# Patient Record
Sex: Female | Born: 1960 | Race: Black or African American | Hispanic: No | Smoking: Current every day smoker
Health system: Southern US, Community
[De-identification: ages and names within clinical notes are randomized; demographics above are authoritative.]

---

## 2014-11-17 ENCOUNTER — Encounter (HOSPITAL_COMMUNITY): Payer: Self-pay | Admitting: Physical Medicine and Rehabilitation

## 2014-11-17 ENCOUNTER — Emergency Department (HOSPITAL_COMMUNITY)
Admission: EM | Admit: 2014-11-17 | Discharge: 2014-11-17 | Disposition: A | Discharge status: Home / Self Care | Payer: Self-pay | Attending: Emergency Medicine | Admitting: Emergency Medicine

## 2014-11-17 DIAGNOSIS — N390 Urinary tract infection, site not specified: Secondary | ICD-10-CM

## 2014-11-17 DIAGNOSIS — Z720 Tobacco use: Secondary | ICD-10-CM | POA: Insufficient documentation

## 2014-11-17 DIAGNOSIS — R112 Nausea with vomiting, unspecified: Secondary | ICD-10-CM

## 2014-11-17 LAB — URINE MICROSCOPIC-ADD ON

## 2014-11-17 LAB — CBC WITH DIFFERENTIAL/PLATELET
BASOS ABS: 0 10*3/uL (ref 0.0–0.1)
Basophils Relative: 0 %
Eosinophils Absolute: 0.1 10*3/uL (ref 0.0–0.7)
Eosinophils Relative: 1 %
HEMATOCRIT: 42.4 % (ref 36.0–46.0)
Hemoglobin: 14.3 g/dL (ref 12.0–15.0)
LYMPHS ABS: 2.7 10*3/uL (ref 0.7–4.0)
LYMPHS PCT: 38 %
MCH: 27.3 pg (ref 26.0–34.0)
MCHC: 33.7 g/dL (ref 30.0–36.0)
MCV: 80.9 fL (ref 78.0–100.0)
MONO ABS: 0.5 10*3/uL (ref 0.1–1.0)
Monocytes Relative: 7 %
NEUTROS ABS: 3.8 10*3/uL (ref 1.7–7.7)
Neutrophils Relative %: 54 %
Platelets: 282 10*3/uL (ref 150–400)
RBC: 5.24 MIL/uL — ABNORMAL HIGH (ref 3.87–5.11)
RDW: 13.3 % (ref 11.5–15.5)
WBC: 7.2 10*3/uL (ref 4.0–10.5)

## 2014-11-17 LAB — URINALYSIS, ROUTINE W REFLEX MICROSCOPIC
BILIRUBIN URINE: NEGATIVE
GLUCOSE, UA: NEGATIVE mg/dL
Ketones, ur: 15 mg/dL — AB
NITRITE: NEGATIVE
PH: 5 (ref 5.0–8.0)
Protein, ur: NEGATIVE mg/dL
SPECIFIC GRAVITY, URINE: 1.016 (ref 1.005–1.030)
Urobilinogen, UA: 1 mg/dL (ref 0.0–1.0)

## 2014-11-17 LAB — BASIC METABOLIC PANEL
ANION GAP: 11 (ref 5–15)
BUN: 13 mg/dL (ref 6–20)
CHLORIDE: 103 mmol/L (ref 101–111)
CO2: 22 mmol/L (ref 22–32)
Calcium: 9.6 mg/dL (ref 8.9–10.3)
Creatinine, Ser: 1.04 mg/dL — ABNORMAL HIGH (ref 0.44–1.00)
GFR calc Af Amer: >60 mL/min (ref >60)
GFR, EST NON AFRICAN AMERICAN: 59 mL/min — AB (ref >60)
GLUCOSE: 99 mg/dL (ref 65–99)
POTASSIUM: 3.9 mmol/L (ref 3.5–5.1)
Sodium: 136 mmol/L (ref 135–145)

## 2014-11-17 MED ORDER — CEPHALEXIN 500 MG PO CAPS: 500.0000 mg | ORAL_CAPSULE | Freq: Two times a day (BID) | ORAL | Status: AC

## 2014-11-17 MED ORDER — METOCLOPRAMIDE HCL 5 MG/ML IJ SOLN
10.0000 mg | Freq: Once | INTRAMUSCULAR | Status: AC
  Administered 2014-11-17: 10 mg via INTRAVENOUS
  Filled 2014-11-17: qty 2

## 2014-11-17 MED ORDER — SODIUM CHLORIDE 0.9 % IV BOLUS (SEPSIS)
1000.0000 mL | Freq: Once | INTRAVENOUS | Status: AC
  Administered 2014-11-17: 1000 mL via INTRAVENOUS

## 2014-11-17 MED ORDER — ONDANSETRON 4 MG PO TBDP: 4.0000 mg | ORAL_TABLET | Freq: Three times a day (TID) | ORAL | Status: AC | PRN

## 2014-11-17 MED ORDER — ONDANSETRON HCL 4 MG/2ML IJ SOLN
4.0000 mg | Freq: Once | INTRAMUSCULAR | Status: AC
  Administered 2014-11-17: 4 mg via INTRAVENOUS
  Filled 2014-11-17: qty 2

## 2014-11-17 NOTE — Progress Notes (Signed)
Felicia Evans °Community Health & Eligibility Specialist °[facility] °[phone] ° °Spoke to patient regarding primary care resources and the [facility] orange card. [facility] orange card application provided and explained, pt instructed to contact me once application was complete for an appointment. Resource guide and my contact information also provided for any future questions or concerns. No other Community Health & Eligibility Specialist needs identified at this time. ° °

## 2014-11-17 NOTE — ED Notes (Signed)
Pt ambulated to the bathroom with ease 

## 2014-11-17 NOTE — ED Notes (Signed)
Pt states nausea/vomiting and diffuse abdominal pain, onset Tuesday evening after eating can of sardines. Actively vomiting upon arrival to exam room.

## 2014-11-17 NOTE — ED Provider Notes (Signed)
CSN: [id number]     Arrival date & time 11/17/14  0850 History   First MD Initiated Contact with Patient 11/17/14 0900     Chief Complaint  Patient presents with  . Emesis  . Abdominal Pain     (Consider location/radiation/quality/duration/timing/severity/associated sxs/prior Treatment) HPI Comments: Pt comes in with c/o nausea and vomiting and diffuse abdominal pain that started yesterday. Denies fever or diarrhea. She state that she ate sardines and the symptoms started after that. She states that is because there were two many scales. No cp or sob. Hasn't tried anything for the symptoms.  The history is provided by the patient. No language interpreter was used.    History reviewed. No pertinent past medical history. History reviewed. No pertinent past surgical history. No family history on file. Social History  Substance Use Topics  . Smoking status: Current Every Day Smoker  . Smokeless tobacco: None  . Alcohol Use: Yes   OB History    No data available     Review of Systems  All other systems reviewed and are negative.     Allergies  Review of patient's allergies indicates no known allergies.  Home Medications   Prior to Admission medications   Not on File   BP 157/102 mmHg  Pulse 110  Temp(Src) 98.6 F (37 C) (Oral)  Resp 18  SpO2 99% Physical Exam  Constitutional: She is oriented to person, place, and time. She appears well-developed and well-nourished.  HENT:  Head: Normocephalic and atraumatic.  Cardiovascular: Normal rate and regular rhythm.   Pulmonary/Chest: Effort normal and breath sounds normal.  Abdominal: Soft. Bowel sounds are normal. There is no tenderness.  Musculoskeletal: Normal range of motion.  Neurological: She is alert and oriented to person, place, and time. She exhibits normal muscle tone. Coordination normal.  Skin: Skin is warm and dry.  Psychiatric: She has a normal mood and affect.  Nursing note and vitals reviewed.   ED  Course  Procedures (including critical care time) Labs Review Labs Reviewed  BASIC METABOLIC PANEL - Abnormal; Notable for the following:    Creatinine, Ser 1.04 (*)    GFR calc non Af Amer 59 (*)    All other components within normal limits  CBC WITH DIFFERENTIAL/PLATELET - Abnormal; Notable for the following:    RBC 5.24 (*)    All other components within normal limits  URINALYSIS, ROUTINE W REFLEX MICROSCOPIC (NOT AT [facility]) - Abnormal; Notable for the following:    APPearance CLOUDY (*)    Hgb urine dipstick SMALL (*)    Ketones, ur 15 (*)    Leukocytes, UA MODERATE (*)    All other components within normal limits  URINE MICROSCOPIC-ADD ON - Abnormal; Notable for the following:    Squamous Epithelial / LPF MANY (*)    Bacteria, UA MANY (*)    All other components within normal limits  URINE CULTURE    Imaging Review No results found. I have personally reviewed and evaluated these images and lab results as part of my medical decision-making.   EKG Interpretation None      MDM   Final diagnoses:  UTI (lower urinary tract infection)  Non-intractable vomiting with nausea, vomiting of unspecified type    Abdomen continues to be benign. Pt tolerating po here without any problem. Will send home with keflex and zofran. Discussed return precautions with pt    Teressa Lower, NP 11/17/14 1158  Laurence Spates, MD 11/17/14 1233

## 2014-11-17 NOTE — ED Notes (Signed)
Pt attempting to give urine sample

## 2014-11-17 NOTE — Discharge Instructions (Signed)
Nausea and Vomiting °Nausea is a sick feeling that often comes before throwing up (vomiting). Vomiting is a reflex where stomach contents come out of your mouth. Vomiting can cause severe loss of body fluids (dehydration). Children and elderly adults can become dehydrated quickly, especially if they also have diarrhea. Nausea and vomiting are symptoms of a condition or disease. It is important to find the cause of your symptoms. °CAUSES  °· Direct irritation of the stomach lining. This irritation can result from increased acid production (gastroesophageal reflux disease), infection, food poisoning, taking certain medicines (such as nonsteroidal anti-inflammatory drugs), alcohol use, or tobacco use. °· Signals from the brain. These signals could be caused by a headache, heat exposure, an inner ear disturbance, increased pressure in the brain from injury, infection, a tumor, or a concussion, pain, emotional stimulus, or metabolic problems. °· An obstruction in the gastrointestinal tract (bowel obstruction). °· Illnesses such as diabetes, hepatitis, gallbladder problems, appendicitis, kidney problems, cancer, sepsis, atypical symptoms of a heart attack, or eating disorders. °· Medical treatments such as chemotherapy and radiation. °· Receiving medicine that makes you sleep (general anesthetic) during surgery. °DIAGNOSIS °Your caregiver may ask for tests to be done if the problems do not improve after a few days. Tests may also be done if symptoms are severe or if the reason for the nausea and vomiting is not clear. Tests may include: °· Urine tests. °· Blood tests. °· Stool tests. °· Cultures (to look for evidence of infection). °· X-rays or other imaging studies. °Test results can help your caregiver make decisions about treatment or the need for additional tests. °TREATMENT °You need to stay well hydrated. Drink frequently but in small amounts. You may wish to drink water, sports drinks, clear broth, or eat frozen  ice pops or gelatin dessert to help stay hydrated. When you eat, eating slowly may help prevent nausea. There are also some antinausea medicines that may help prevent nausea. °HOME CARE INSTRUCTIONS  °· Take all medicine as directed by your caregiver. °· If you do not have an appetite, do not force yourself to eat. However, you must continue to drink fluids. °· If you have an appetite, eat a normal diet unless your caregiver tells you differently. °¨ Eat a variety of complex carbohydrates (rice, wheat, potatoes, bread), lean meats, yogurt, fruits, and vegetables. °¨ Avoid high-fat foods because they are more difficult to digest. °· Drink enough water and fluids to keep your urine clear or pale yellow. °· If you are dehydrated, ask your caregiver for specific rehydration instructions. Signs of dehydration may include: °¨ Severe thirst. °¨ Dry lips and mouth. °¨ Dizziness. °¨ Dark urine. °¨ Decreasing urine frequency and amount. °¨ Confusion. °¨ Rapid breathing or pulse. °SEEK IMMEDIATE MEDICAL CARE IF:  °· You have blood or brown flecks (like coffee grounds) in your vomit. °· You have black or bloody stools. °· You have a severe headache or stiff neck. °· You are confused. °· You have severe abdominal pain. °· You have chest pain or trouble breathing. °· You do not urinate at least once every 8 hours. °· You develop cold or clammy skin. °· You continue to vomit for longer than 24 to 48 hours. °· You have a fever. °MAKE SURE YOU:  °· Understand these instructions. °· Will watch your condition. °· Will get help right away if you are not doing well or get worse. °Document Released: 02/19/2005 Document Revised: 05/14/2011 Document Reviewed: 07/19/2010 °ExitCare® Patient Information ©2015 [employer]. This information is not intended   to replace advice given to you by your health care provider. Make sure you discuss any questions you have with your health care provider. ° °Urinary Tract Infection °Urinary tract  infections (UTIs) can develop anywhere along your urinary tract. Your urinary tract is your body's drainage system for removing wastes and extra water. Your urinary tract includes two kidneys, two ureters, a bladder, and a urethra. Your kidneys are a pair of bean-shaped organs. Each kidney is about the size of your fist. They are located below your ribs, one on each side of your spine. °CAUSES °Infections are caused by microbes, which are microscopic organisms, including fungi, viruses, and bacteria. These organisms are so small that they can only be seen through a microscope. Bacteria are the microbes that most commonly cause UTIs. °SYMPTOMS  °Symptoms of UTIs may vary by age and gender of the patient and by the location of the infection. Symptoms in young women typically include a frequent and intense urge to urinate and a painful, burning feeling in the bladder or urethra during urination. Older women and men are more likely to be tired, shaky, and weak and have muscle aches and abdominal pain. A fever may mean the infection is in your kidneys. Other symptoms of a kidney infection include pain in your back or sides below the ribs, nausea, and vomiting. °DIAGNOSIS °To diagnose a UTI, your caregiver will ask you about your symptoms. Your caregiver also will ask to provide a urine sample. The urine sample will be tested for bacteria and white blood cells. White blood cells are made by your body to help fight infection. °TREATMENT  °Typically, UTIs can be treated with medication. Because most UTIs are caused by a bacterial infection, they usually can be treated with the use of antibiotics. The choice of antibiotic and length of treatment depend on your symptoms and the type of bacteria causing your infection. °HOME CARE INSTRUCTIONS °· If you were prescribed antibiotics, take them exactly as your caregiver instructs you. Finish the medication even if you feel better after you have only taken some of the  medication. °· Drink enough water and fluids to keep your urine clear or pale yellow. °· Avoid caffeine, tea, and carbonated beverages. They tend to irritate your bladder. °· Empty your bladder often. Avoid holding urine for long periods of time. °· Empty your bladder before and after sexual intercourse. °· After a bowel movement, women should cleanse from front to back. Use each tissue only once. °SEEK MEDICAL CARE IF:  °· You have back pain. °· You develop a fever. °· Your symptoms do not begin to resolve within 3 days. °SEEK IMMEDIATE MEDICAL CARE IF:  °· You have severe back pain or lower abdominal pain. °· You develop chills. °· You have nausea or vomiting. °· You have continued burning or discomfort with urination. °MAKE SURE YOU:  °· Understand these instructions. °· Will watch your condition. °· Will get help right away if you are not doing well or get worse. °Document Released: 11/29/2004 Document Revised: 08/21/2011 Document Reviewed: 03/30/2011 °ExitCare® Patient Information ©2015 [employer]. This information is not intended to replace advice given to you by your health care provider. Make sure you discuss any questions you have with your health care provider. ° °

## 2014-11-17 NOTE — ED Notes (Signed)
Pt tolerated Ginger Ale well, no complaints.

## 2014-11-18 LAB — URINE CULTURE

## 2015-12-29 ENCOUNTER — Emergency Department (HOSPITAL_COMMUNITY)
Admission: EM | Admit: 2015-12-29 | Discharge: 2015-12-29 | Disposition: A | Discharge status: Home / Self Care | Payer: No Typology Code available for payment source | Attending: Emergency Medicine | Admitting: Emergency Medicine

## 2015-12-29 ENCOUNTER — Emergency Department (HOSPITAL_COMMUNITY): Payer: No Typology Code available for payment source

## 2015-12-29 ENCOUNTER — Encounter (HOSPITAL_COMMUNITY): Payer: Self-pay

## 2015-12-29 DIAGNOSIS — S7002XA Contusion of left hip, initial encounter: Secondary | ICD-10-CM | POA: Insufficient documentation

## 2015-12-29 DIAGNOSIS — S79912A Unspecified injury of left hip, initial encounter: Secondary | ICD-10-CM | POA: Diagnosis present

## 2015-12-29 DIAGNOSIS — Y929 Unspecified place or not applicable: Secondary | ICD-10-CM | POA: Diagnosis not present

## 2015-12-29 DIAGNOSIS — F17200 Nicotine dependence, unspecified, uncomplicated: Secondary | ICD-10-CM | POA: Insufficient documentation

## 2015-12-29 DIAGNOSIS — Y999 Unspecified external cause status: Secondary | ICD-10-CM | POA: Insufficient documentation

## 2015-12-29 DIAGNOSIS — Y939 Activity, unspecified: Secondary | ICD-10-CM | POA: Insufficient documentation

## 2015-12-29 MED ORDER — IBUPROFEN 400 MG PO TABS: 400.0000 mg | ORAL_TABLET | Freq: Once | ORAL | Status: DC
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
## 2015-12-29 NOTE — ED Notes (Signed)
Pt is in stable condition upon d/c and is escorted from ED via wheelchair. 
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
## 2015-12-29 NOTE — Discharge Instructions (Signed)
It was our pleasure to provide your ER care today - we hope that you feel better.  You may try taking motrin or aleve as need for pain.  Follow up with primary care doctor in the next couple weeks if symptoms fail to improve/resolve.
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
